# Patient Record
Sex: Female | Born: 2000 | Race: White | Hispanic: No | Marital: Single | Smoking: Former smoker
Health system: Southern US, Community
[De-identification: ages and names within clinical notes are randomized; demographics above are authoritative.]

## PROBLEM LIST (undated history)

## (undated) DIAGNOSIS — K219 Gastro-esophageal reflux disease without esophagitis: Secondary | ICD-10-CM

## (undated) DIAGNOSIS — K589 Irritable bowel syndrome without diarrhea: Secondary | ICD-10-CM

## (undated) DIAGNOSIS — T7840XA Allergy, unspecified, initial encounter: Secondary | ICD-10-CM

## (undated) DIAGNOSIS — N926 Irregular menstruation, unspecified: Secondary | ICD-10-CM

## (undated) DIAGNOSIS — F909 Attention-deficit hyperactivity disorder, unspecified type: Secondary | ICD-10-CM

## (undated) HISTORY — DX: Irregular menstruation, unspecified: N92.6

## (undated) HISTORY — DX: Allergy, unspecified, initial encounter: T78.40XA

## (undated) HISTORY — DX: Irritable bowel syndrome without diarrhea: K58.9

## (undated) HISTORY — DX: Gastro-esophageal reflux disease without esophagitis: K21.9

---

## 2019-01-19 ENCOUNTER — Other Ambulatory Visit: Payer: Self-pay

## 2019-01-19 ENCOUNTER — Emergency Department (HOSPITAL_COMMUNITY)
Admission: EM | Admit: 2019-01-19 | Discharge: 2019-01-19 | Disposition: A | Discharge status: Home / Self Care | Payer: Managed Care, Other (non HMO) | Attending: Emergency Medicine | Admitting: Emergency Medicine

## 2019-01-19 ENCOUNTER — Emergency Department (HOSPITAL_COMMUNITY): Payer: Managed Care, Other (non HMO)

## 2019-01-19 ENCOUNTER — Encounter (HOSPITAL_COMMUNITY): Payer: Self-pay

## 2019-01-19 DIAGNOSIS — N12 Tubulo-interstitial nephritis, not specified as acute or chronic: Secondary | ICD-10-CM | POA: Insufficient documentation

## 2019-01-19 DIAGNOSIS — R1031 Right lower quadrant pain: Secondary | ICD-10-CM | POA: Diagnosis present

## 2019-01-19 HISTORY — DX: Attention-deficit hyperactivity disorder, unspecified type: F90.9

## 2019-01-19 LAB — URINALYSIS, ROUTINE W REFLEX MICROSCOPIC
Bilirubin Urine: NEGATIVE
Glucose, UA: NEGATIVE mg/dL
KETONES UR: NEGATIVE mg/dL
Leukocytes,Ua: NEGATIVE
Nitrite: NEGATIVE
Protein, ur: 100 mg/dL — AB
RBC / HPF: >50 RBC/hpf — ABNORMAL HIGH (ref 0–5)
Specific Gravity, Urine: 1.02 (ref 1.005–1.030)
pH: 5 (ref 5.0–8.0)

## 2019-01-19 LAB — I-STAT BETA HCG BLOOD, ED (MC, WL, AP ONLY): I-stat hCG, quantitative: <5 m[IU]/mL (ref <5)

## 2019-01-19 LAB — COMPREHENSIVE METABOLIC PANEL
ALT: 17 U/L (ref 0–44)
AST: 15 U/L (ref 15–41)
Albumin: 4.2 g/dL (ref 3.5–5.0)
Alkaline Phosphatase: 77 U/L (ref 38–126)
Anion gap: 8 (ref 5–15)
BUN: 11 mg/dL (ref 6–20)
CO2: 23 mmol/L (ref 22–32)
Calcium: 8.8 mg/dL — ABNORMAL LOW (ref 8.9–10.3)
Chloride: 108 mmol/L (ref 98–111)
Creatinine, Ser: 0.64 mg/dL (ref 0.44–1.00)
GFR calc Af Amer: >60 mL/min (ref >60)
GFR calc non Af Amer: >60 mL/min (ref >60)
GLUCOSE: 135 mg/dL — AB (ref 70–99)
Potassium: 4 mmol/L (ref 3.5–5.1)
Sodium: 139 mmol/L (ref 135–145)
Total Bilirubin: 0.7 mg/dL (ref 0.3–1.2)
Total Protein: 7 g/dL (ref 6.5–8.1)

## 2019-01-19 LAB — CBC
HCT: 38.7 % (ref 36.0–46.0)
Hemoglobin: 13.1 g/dL (ref 12.0–15.0)
MCH: 30.7 pg (ref 26.0–34.0)
MCHC: 33.9 g/dL (ref 30.0–36.0)
MCV: 90.6 fL (ref 80.0–100.0)
Platelets: 230 10*3/uL (ref 150–400)
RBC: 4.27 MIL/uL (ref 3.87–5.11)
RDW: 12.5 % (ref 11.5–15.5)
WBC: 12.3 10*3/uL — ABNORMAL HIGH (ref 4.0–10.5)
nRBC: 0 % (ref 0.0–0.2)

## 2019-01-19 LAB — LIPASE, BLOOD: Lipase: 30 U/L (ref 11–51)

## 2019-01-19 MED ORDER — SODIUM CHLORIDE 0.9% FLUSH: 3.0000 mL | Freq: Once | INTRAVENOUS | Status: DC

## 2019-01-19 MED ORDER — NAPROXEN 500 MG PO TABS: 500.0000 mg | ORAL_TABLET | Freq: Two times a day (BID) | ORAL | 0 refills | Status: DC

## 2019-01-19 MED ORDER — CEPHALEXIN 500 MG PO CAPS: 500.0000 mg | ORAL_CAPSULE | Freq: Four times a day (QID) | ORAL | 0 refills | Status: DC

## 2019-01-19 MED ORDER — SODIUM CHLORIDE 0.9 % IV BOLUS
1000.0000 mL | Freq: Once | INTRAVENOUS | Status: AC
  Administered 2019-01-19: 1000 mL via INTRAVENOUS

## 2019-01-19 MED ORDER — ONDANSETRON 4 MG PO TBDP: 4.0000 mg | ORAL_TABLET | Freq: Three times a day (TID) | ORAL | 0 refills | Status: DC | PRN

## 2019-01-19 MED ORDER — SODIUM CHLORIDE 0.9 % IV SOLN
1.0000 g | Freq: Once | INTRAVENOUS | Status: AC
  Administered 2019-01-19: 1 g via INTRAVENOUS
  Filled 2019-01-19: qty 10

## 2019-01-19 MED ORDER — KETOROLAC TROMETHAMINE 15 MG/ML IJ SOLN
15.0000 mg | Freq: Once | INTRAMUSCULAR | Status: AC
  Administered 2019-01-19: 15 mg via INTRAVENOUS
  Filled 2019-01-19: qty 1

## 2019-01-19 NOTE — ED Triage Notes (Signed)
Per EMS- Patient is from [facility]. Patient c/o RLQ abdominal pain 9/10 and N/V.  Patient was given Fentanyl 50 mcg prior to arrival to the ED.

## 2019-01-19 NOTE — Discharge Instructions (Addendum)
You were seen in the emergency department today for flank/abdominal pain.  This time we suspect that your symptoms are related to a bladder infection that had spread to your kidney given you have blood in your urine as well as many bacteria.  We are sending your urine for culture to determine if this is the cause.  Additional labs show that your blood sugar was a bit high at 135, have this rechecked by primary care provider.  Your white blood cell count was a bit high at 12.3, this is consistent with infection.  Your CT scan showed stool in the colon, however it did not show an obstruction, kidney stone, or appendicitis.  Detailed results are below.  We are sending you home with multiple medications to treat your infection and symptoms:  - Keflex-is an antibiotic to treat the infection.  Take this 4 times per day for the next 7 days. - Naproxen is a nonsteroidal anti-inflammatory medication that will help with pain and swelling. Be sure to take this medication as prescribed with food, 1 pill every 12 hours,  It should be taken with food, as it can cause stomach upset, and more seriously, stomach bleeding. Do not take other nonsteroidal anti-inflammatory medications with this such as Advil, Motrin, Aleve, Mobic, Goodie Powder, or Motrin.   - Zofran - this is an anti-nausea medicine to take every 8 hours as needed for nausea/vomiting.   You make take Tylenol per over the counter dosing with these medications.   We have prescribed you new medication(s) today. Discuss the medications prescribed today with your pharmacist as they can have adverse effects and interactions with your other medicines including over the counter and prescribed medications. Seek medical evaluation if you start to experience new or abnormal symptoms after taking one of these medicines, seek care immediately if you start to experience difficulty breathing, feeling of your throat closing, facial swelling, or rash as these could be  indications of a more serious allergic reaction   Please follow-up with your primary care provider within 3 days for reevaluation.  If you do not have a primary care provider you may follow-up with student health, the [facility] [place] [facility], or you may call the phone number circled in your discharge instructions for primary care in the area.  Return to the ER should you experience new or worsening symptoms including but not limited to worsening pain, inability to keep fluids down, fever, or any other concerns.   Results for orders placed or performed during the hospital encounter of 01/19/19  Lipase, blood  Result Value Ref Range   Lipase 30 11 - 51 U/L  Comprehensive metabolic panel  Result Value Ref Range   Sodium 139 135 - 145 mmol/L   Potassium 4.0 3.5 - 5.1 mmol/L   Chloride 108 98 - 111 mmol/L   CO2 23 22 - 32 mmol/L   Glucose, Bld 135 (H) 70 - 99 mg/dL   BUN 11 6 - 20 mg/dL   Creatinine, Ser 9.35 0.44 - 1.00 mg/dL   Calcium 8.8 (L) 8.9 - 10.3 mg/dL   Total Protein 7.0 6.5 - 8.1 g/dL   Albumin 4.2 3.5 - 5.0 g/dL   AST 15 15 - 41 U/L   ALT 17 0 - 44 U/L   Alkaline Phosphatase 77 38 - 126 U/L   Total Bilirubin 0.7 0.3 - 1.2 mg/dL   GFR calc non Af Amer >60 >60 mL/min   GFR calc Af Amer >60 >  60 mL/min   Anion gap 8 5 - 15  CBC  Result Value Ref Range   WBC 12.3 (H) 4.0 - 10.5 K/uL   RBC 4.27 3.87 - 5.11 MIL/uL   Hemoglobin 13.1 12.0 - 15.0 g/dL   HCT 81.8 29.9 - 37.1 %   MCV 90.6 80.0 - 100.0 fL   MCH 30.7 26.0 - 34.0 pg   MCHC 33.9 30.0 - 36.0 g/dL   RDW 69.6 78.9 - 38.1 %   Platelets 230 150 - 400 K/uL   nRBC 0.0 0.0 - 0.2 %  Urinalysis, Routine w reflex microscopic  Result Value Ref Range   Color, Urine AMBER (A) YELLOW   APPearance CLOUDY (A) CLEAR   Specific Gravity, Urine 1.020 1.005 - 1.030   pH 5.0 5.0 - 8.0   Glucose, UA NEGATIVE NEGATIVE mg/dL   Hgb urine dipstick LARGE (A) NEGATIVE   Bilirubin Urine NEGATIVE NEGATIVE   Ketones, ur  NEGATIVE NEGATIVE mg/dL   Protein, ur 017 (A) NEGATIVE mg/dL   Nitrite NEGATIVE NEGATIVE   Leukocytes,Ua NEGATIVE NEGATIVE   RBC / HPF >50 (H) 0 - 5 RBC/hpf   WBC, UA 21-50 0 - 5 WBC/hpf   Bacteria, UA MANY (A) NONE SEEN   Squamous Epithelial / LPF 6-10 0 - 5   WBC Clumps PRESENT    Mucus PRESENT   I-Stat beta hCG blood, ED  Result Value Ref Range   I-stat hCG, quantitative <5.0 <5 mIU/mL   Comment 3           Ct Renal Stone Study  Result Date: 01/19/2019 CLINICAL DATA:  Right lower quadrant pain, nausea, vomiting, hematuria EXAM: CT ABDOMEN AND PELVIS WITHOUT CONTRAST TECHNIQUE: Multidetector CT imaging of the abdomen and pelvis was performed following the standard protocol without IV contrast. COMPARISON:  None. FINDINGS: Lower chest: No acute abnormality. Hepatobiliary: No focal liver abnormality is seen. No gallstones, gallbladder wall thickening, or biliary dilatation. Pancreas: Unremarkable. No pancreatic ductal dilatation or surrounding inflammatory changes. Spleen: Normal in size without focal abnormality. Adrenals/Urinary Tract: Adrenal glands are unremarkable. Kidneys are normal, without renal calculi, focal lesion, or hydronephrosis. Bladder is unremarkable. Stomach/Bowel: Stomach is within normal limits. Appendix appears normal. No evidence of bowel wall thickening, distention, or inflammatory changes. Large burden of stool in the colon. Vascular/Lymphatic: No significant vascular findings are present. No enlarged abdominal or pelvic lymph nodes. Reproductive: No mass or other abnormality. Other: No abdominal wall hernia or abnormality. No abdominopelvic ascites. Musculoskeletal: No acute or significant osseous findings. IMPRESSION: 1. No definite non-contrast CT findings of the abdomen or pelvis to explain pain. 2.  No evidence of urinary tract calculus or hydronephrosis. 3.  Normal appendix. 4.  Large burden of stool in colon. Electronically Signed   By: Lauralyn Primes M.D.   On:  01/19/2019 15:53

## 2019-01-19 NOTE — ED Provider Notes (Signed)
[facility]-EMERGENCY DEPT Provider Note   CSN: [id number] Arrival date & time: 01/19/19  1418    History   Chief Complaint Chief Complaint  Patient presents with   Abdominal Pain   Emesis    HPI Kristie Horn is a 18 y.o. female with a hx of ADHD who arrives to the ED via EMS with complaints of abdominal pain that began at 0800 this AM. Patient states she awoke with patient to the R flank/RLQ that was mild, she was able to go back to sleep, however she then woke up with increased intensity of pain associated w/ nausea. EMS was called. She received  of fentanyl en route w/ significant improvement. Current pain is a 1/10 in severity and she does not feel nauseous at present. She notes she has had some blood in her urine the past two days as well which is atypical for her. Denies fever, emesis (despite triage note), diarrhea, melena, hematochezia, dysuria, vaginal bleeding, or vaginal discharge. She is not currently sexually active and has no concern for STD.      HPI  Past Medical History:  Diagnosis Date   ADHD     There are no active problems to display for this patient.   History reviewed. No pertinent surgical history.   OB History   No obstetric history on file.      Home Medications    Prior to Admission medications   Not on File    Family History Family History  Problem Relation Age of Onset   Crohn's disease Mother    Healthy Father     Social History Social History   Tobacco Use   Smoking status: Never Smoker   Smokeless tobacco: Never Used  Substance Use Topics   Alcohol use: Never    Frequency: Never   Drug use: Never     Allergies   Other   Review of Systems Review of Systems  Constitutional: Negative for fever.  Respiratory: Negative for shortness of breath.   Cardiovascular: Negative for chest pain.  Gastrointestinal: Positive for abdominal pain and nausea. Negative for blood in stool, constipation,  diarrhea and vomiting.  Genitourinary: Positive for flank pain and hematuria. Negative for dysuria, frequency, urgency, vaginal bleeding and vaginal discharge.  All other systems reviewed and are negative.    Physical Exam Updated Vital Signs BP 97/61 (BP Location: Right Arm)    Pulse 60    Temp 97.6 F (36.4 C) (Oral)    Resp 13    Ht 5\' 3"  (1.6 m)    Wt 63.5 kg    LMP 12/21/2018 (Approximate) Comment: irregular periods   SpO2 100%    BMI 24.80 kg/m   Physical Exam Vitals signs and nursing note reviewed.  Constitutional:      General: She is not in acute distress.    Appearance: She is well-developed. She is not toxic-appearing.  HENT:     Head: Normocephalic and atraumatic.  Eyes:     General:        Right eye: No discharge.        Left eye: No discharge.     Conjunctiva/sclera: Conjunctivae normal.  Neck:     Musculoskeletal: Neck supple.  Cardiovascular:     Rate and Rhythm: Normal rate and regular rhythm.  Pulmonary:     Effort: Pulmonary effort is normal. No respiratory distress.     Breath sounds: Normal breath sounds. No wheezing, rhonchi or rales.  Abdominal:  General: There is no distension.     Palpations: Abdomen is soft.     Tenderness: There is no abdominal tenderness. There is no right CVA tenderness, left CVA tenderness, guarding or rebound. Negative signs include McBurney's sign.  Skin:    General: Skin is warm and dry.     Findings: No rash.  Neurological:     Mental Status: She is alert.     Comments: Clear speech.   Psychiatric:        Behavior: Behavior normal.      ED Treatments / Results  Labs (all labs ordered are listed, but only abnormal results are displayed) Labs Reviewed  COMPREHENSIVE METABOLIC PANEL - Abnormal; Notable for the following components:      Result Value   Glucose, Bld 135 (*)    Calcium 8.8 (*)    All other components within normal limits  CBC - Abnormal; Notable for the following components:   WBC 12.3 (*)     All other components within normal limits  URINALYSIS, ROUTINE W REFLEX MICROSCOPIC - Abnormal; Notable for the following components:   Color, Urine AMBER (*)    APPearance CLOUDY (*)    Hgb urine dipstick LARGE (*)    Protein, ur 100 (*)    RBC / HPF >50 (*)    Bacteria, UA MANY (*)    All other components within normal limits  URINE CULTURE  LIPASE, BLOOD  I-STAT BETA HCG BLOOD, ED ([facility], [facility], [facility] ONLY)    EKG None  Radiology Ct Renal Stone Study  Result Date: 01/19/2019 CLINICAL DATA:  Right lower quadrant pain, nausea, vomiting, hematuria EXAM: CT ABDOMEN AND PELVIS WITHOUT CONTRAST TECHNIQUE: Multidetector CT imaging of the abdomen and pelvis was performed following the standard protocol without IV contrast. COMPARISON:  None. FINDINGS: Lower chest: No acute abnormality. Hepatobiliary: No focal liver abnormality is seen. No gallstones, gallbladder wall thickening, or biliary dilatation. Pancreas: Unremarkable. No pancreatic ductal dilatation or surrounding inflammatory changes. Spleen: Normal in size without focal abnormality. Adrenals/Urinary Tract: Adrenal glands are unremarkable. Kidneys are normal, without renal calculi, focal lesion, or hydronephrosis. Bladder is unremarkable. Stomach/Bowel: Stomach is within normal limits. Appendix appears normal. No evidence of bowel wall thickening, distention, or inflammatory changes. Large burden of stool in the colon. Vascular/Lymphatic: No significant vascular findings are present. No enlarged abdominal or pelvic lymph nodes. Reproductive: No mass or other abnormality. Other: No abdominal wall hernia or abnormality. No abdominopelvic ascites. Musculoskeletal: No acute or significant osseous findings. IMPRESSION: 1. No definite non-contrast CT findings of the abdomen or pelvis to explain pain. 2.  No evidence of urinary tract calculus or hydronephrosis. 3.  Normal appendix. 4.  Large burden of stool in colon. Electronically Signed   By: Lauralyn Primes  M.D.   On: 01/19/2019 15:53    Procedures Procedures (including critical care time)  Medications Ordered in ED Medications  sodium chloride flush (NS) 0.9 % injection 3 mL (has no administration in time range)  cefTRIAXone (ROCEPHIN) 1 g in sodium chloride 0.9 % 100 mL IVPB (has no administration in time range)  ketorolac (TORADOL) 15 MG/ML injection 15 mg (has no administration in time range)  sodium chloride 0.9 % bolus 1,000 mL (1,000 mLs Intravenous New Bag/Given 01/19/19 1608)     Initial Impression / Assessment and Plan / ED Course  I have reviewed the triage vital signs and the nursing notes.  Pertinent labs & imaging results that were available during my care of the patient  were reviewed by me and considered in my medical decision making (see chart for details).    Patient presents to the ED with complaints of abdominal/flank pain. Patient nontoxic appearing, in no apparent distress, vitals WNL upon arrival. On exam patient has a nontender abdomen, no peritoneal signs. DDX: nephrolithiasis, UTI/pyelo, appendicitis, pancreatitis, cholecystitis, obstruction/perforation, PID, ectopic pregnancy, MSK. Will evaluate with labs and CT renal study. Fluids ordered, patient informed to alert me of return of discomfort or nausea.   ER work-up reviewed:  CBC: Mild leukocytosis @ 12.3. No anemia CMP: Mild hyperglycemia @ 135. Mild hypocalcemia @ 8.8. Renal function preserved. LFTs WNL Lipase: WNL UA: Concerning for infection w/ many bacteria, hematuria.  Preg test: Negative Imaging:  No definite non-contrast CT findings of the abdomen or pelvis to explain pain. No evidence of urinary tract calculus or hydronephrosis. Normal appendix. Large burden of stool in colon.  Repeat abdominal exam patient remains nontender without peritoneal signs.  She again denies concern for STD, she is nontender, doubt PID.  CT scan reassuring, no stone, no appendicitis, no obstruction/perforation.  At this time  suspect pyelonephritis given dirty urine with flank pain.  She will be given Toradol for additional pain control as some of the pain has started to return as well as a 1g of Rocephin. Urine culture added on.   Patient tolerating PO in the emergency department, feeling much better. She does not appear septic or to require admission at this time, will discharge home with keflex & naproxen. I discussed results, treatment plan, need for PCP follow-up, and return precautions with the patient & her mother. Provided opportunity for questions, patient & her mother confirmed understanding and are in agreement with plan.   Final Clinical Impressions(s) / ED Diagnoses   Final diagnoses:  Pyelonephritis    ED Discharge Orders         Ordered    cephALEXin (KEFLEX) 500 MG capsule  4 times daily     01/19/19 1818    naproxen (NAPROSYN) 500 MG tablet  2 times daily     01/19/19 1818    ondansetron (ZOFRAN ODT) 4 MG disintegrating tablet  Every 8 hours PRN     01/19/19 1818           Aulton Routt, Pleas Koch, PA-C 01/19/19 1826    Gerhard Munch, MD 01/21/19 [phone]

## 2019-01-20 LAB — URINE CULTURE

## 2019-09-18 IMAGING — CT CT RENAL STONE PROTOCOL
2 of 4 series · 17 of 46 positions shown, 19 images · non-contrast
Comparison: None.

CLINICAL DATA: Right lower quadrant pain, nausea, vomiting,
hematuria

EXAM:
CT ABDOMEN AND PELVIS WITHOUT CONTRAST
TECHNIQUE: Multidetector CT imaging of the abdomen and pelvis was performed
following the standard protocol without IV contrast.

[Series 2: axial st · axial · 0.69mm/px · z∈[+1352,+1748]mm · 14 of 89 slices shown, 16 images]
[im 5/89  soft-tissue]
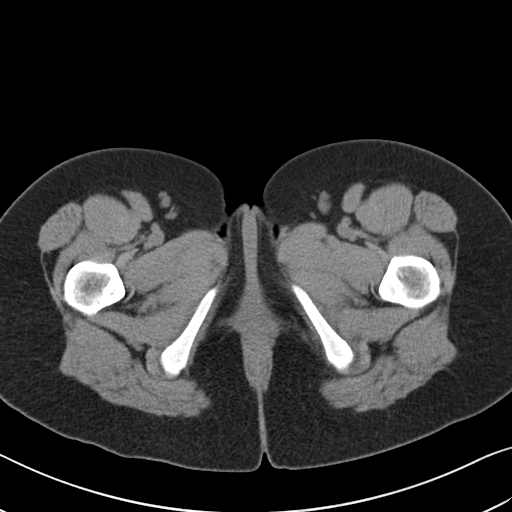
[im 5/89  bone]
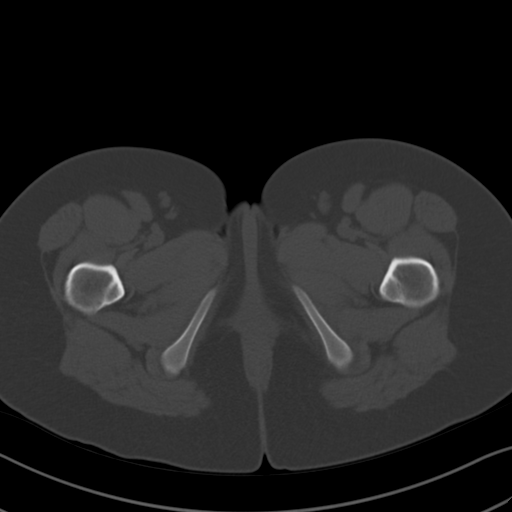
[im 10/89  soft-tissue]
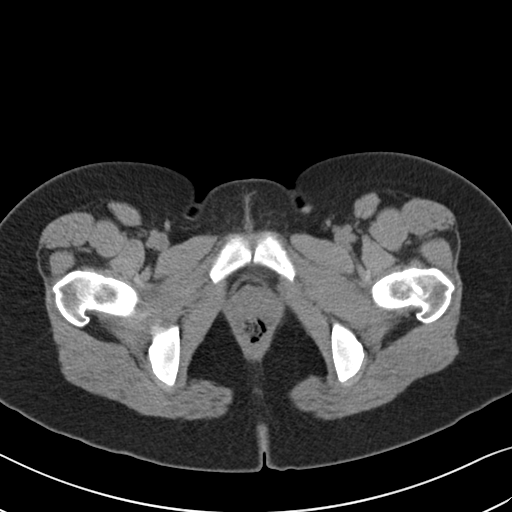
[im 19/89  soft-tissue]
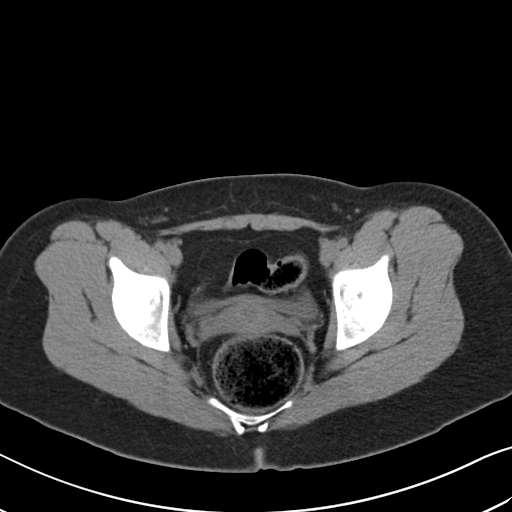
[im 24/89  soft-tissue]
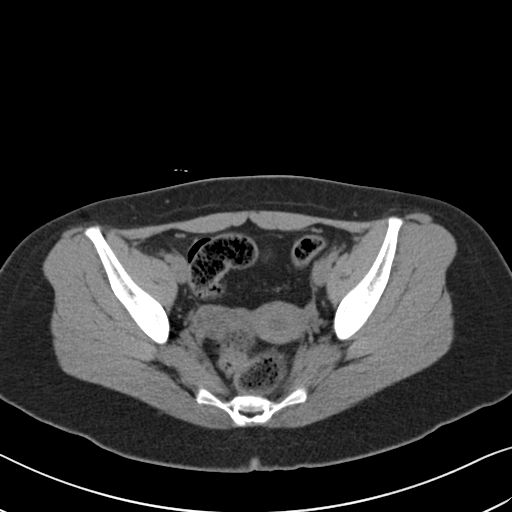
[im 28/89  soft-tissue]
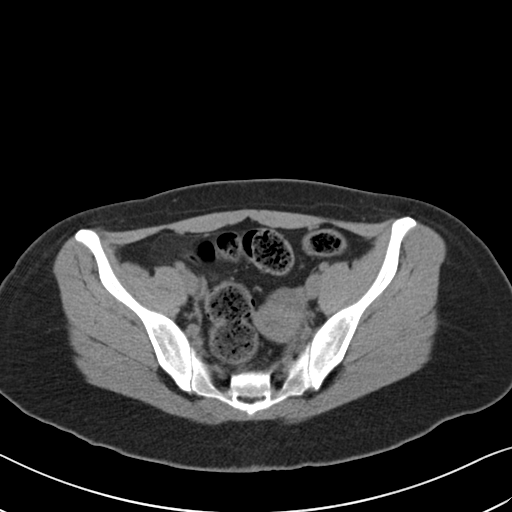
[im 38/89  soft-tissue]
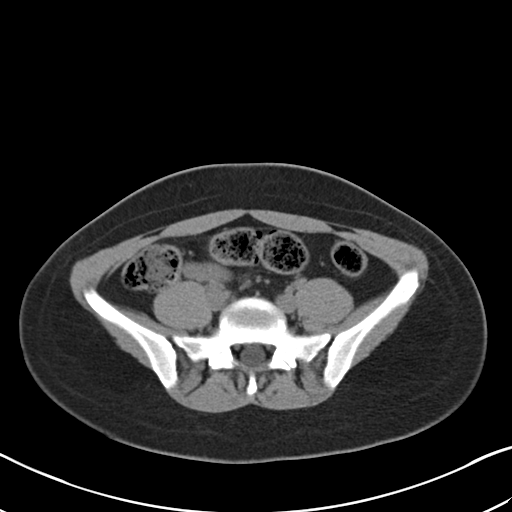
[im 42/89  soft-tissue]
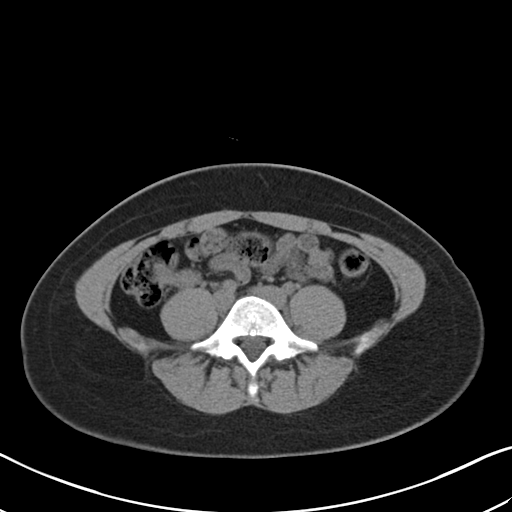
[im 47/89  soft-tissue]
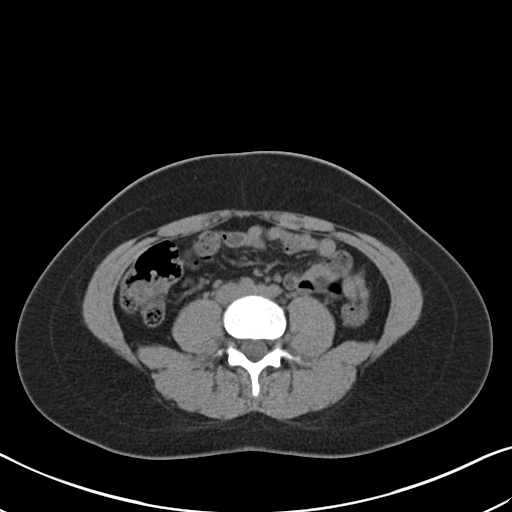
[im 51/89  soft-tissue]
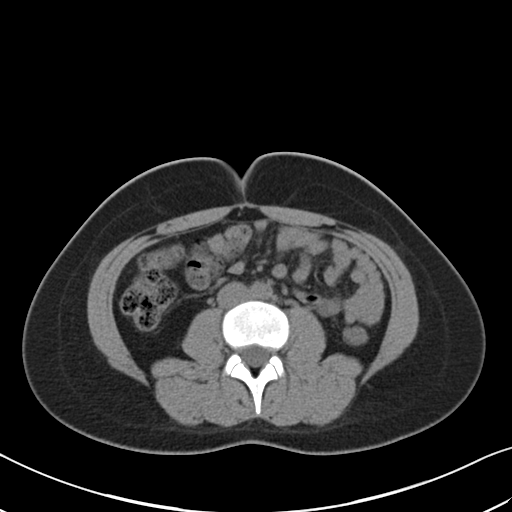
[im 51/89  bone]
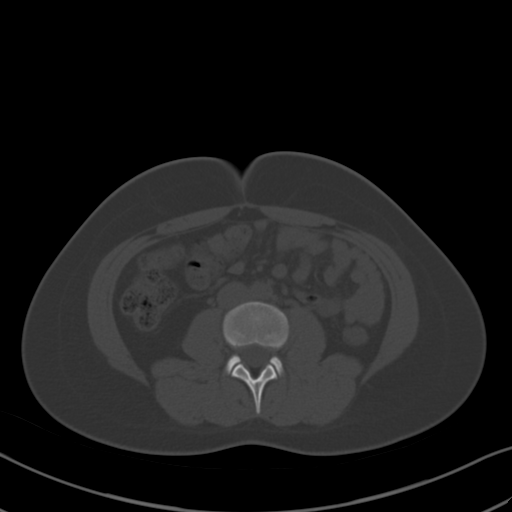
[im 61/89  soft-tissue]
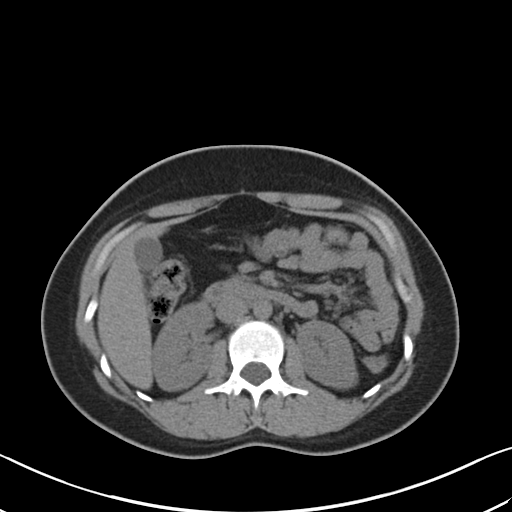
[im 65/89  soft-tissue]
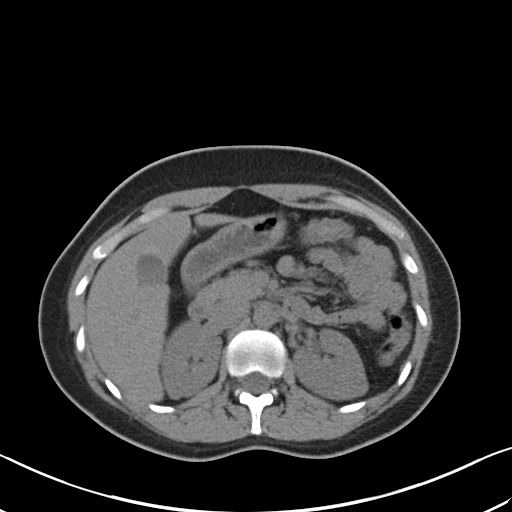
[im 70/89  soft-tissue]
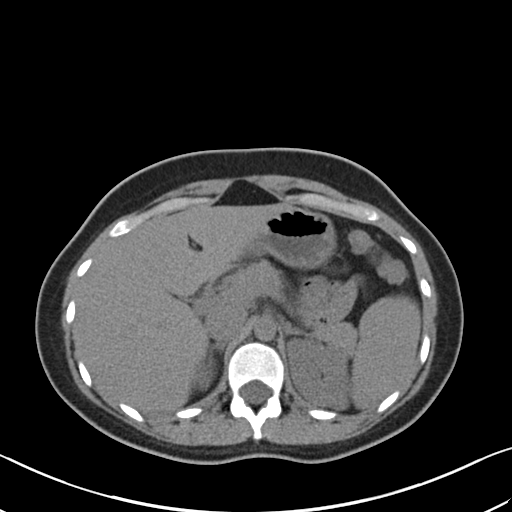
[im 79/89  soft-tissue]
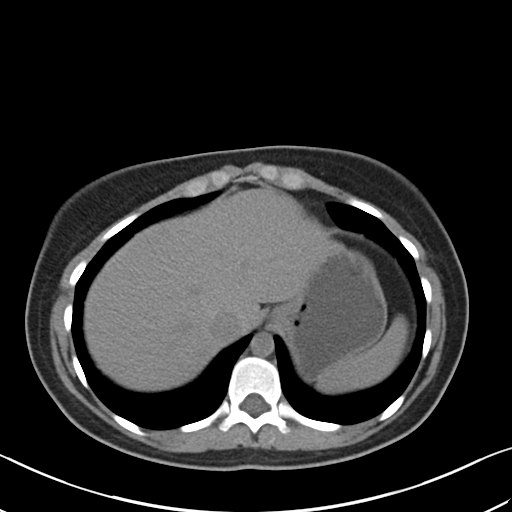
[im 84/89  soft-tissue]
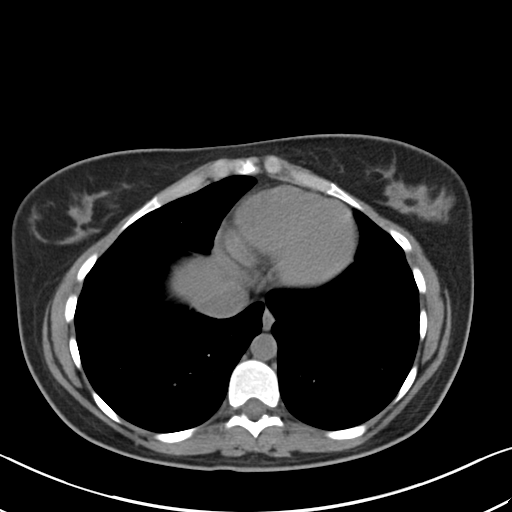

[Series 4: coronal · coronal · 0.92mm/px · 3 of 102 slices shown]
[im 34/102  soft-tissue]
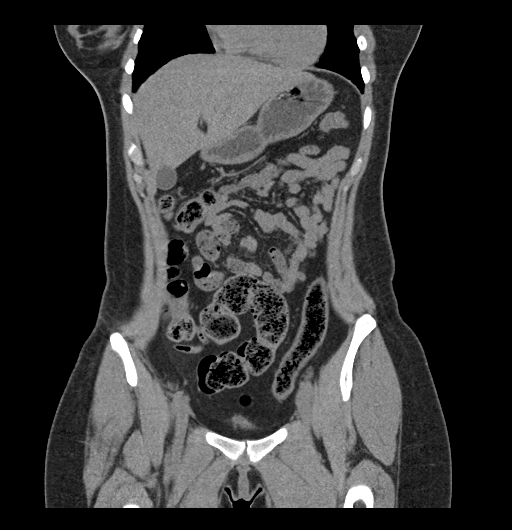
[im 45/102  soft-tissue]
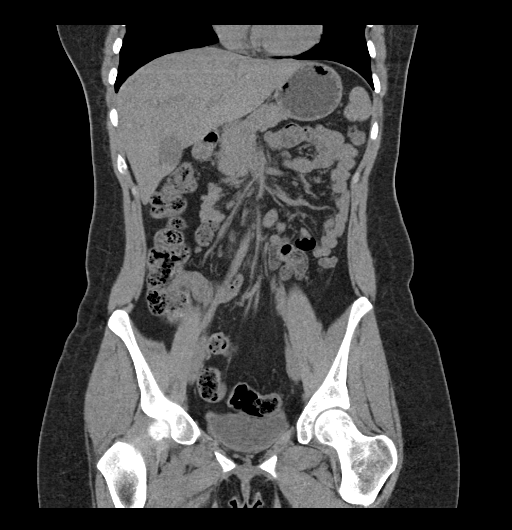
[im 57/102  soft-tissue]
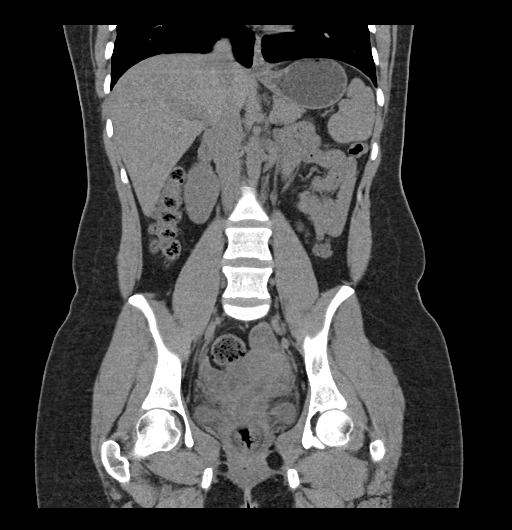

[17 of 46 positions shown; findings below may reference images not displayed]

FINDINGS: Lower chest: No acute abnormality.

Hepatobiliary: No focal liver abnormality is seen. No gallstones,
gallbladder wall thickening, or biliary dilatation.

Pancreas: Unremarkable. No pancreatic ductal dilatation or
surrounding inflammatory changes.

Spleen: Normal in size without focal abnormality.

Adrenals/Urinary Tract: Adrenal glands are unremarkable. Kidneys are
normal, without renal calculi, focal lesion, or hydronephrosis.
Bladder is unremarkable.

Stomach/Bowel: Stomach is within normal limits. Appendix appears
normal. No evidence of bowel wall thickening, distention, or
inflammatory changes. Large burden of stool in the colon.

Vascular/Lymphatic: No significant vascular findings are present. No
enlarged abdominal or pelvic lymph nodes.

Reproductive: No mass or other abnormality.

Other: No abdominal wall hernia or abnormality. No abdominopelvic
ascites.

Musculoskeletal: No acute or significant osseous findings.
IMPRESSION: 1. No definite non-contrast CT findings of the abdomen or pelvis to
explain pain.

2.  No evidence of urinary tract calculus or hydronephrosis.

3.  Normal appendix.

4.  Large burden of stool in colon.

## 2021-04-28 LAB — HM PAP SMEAR

## 2022-05-21 ENCOUNTER — Encounter (HOSPITAL_COMMUNITY): Payer: Self-pay

## 2022-05-21 ENCOUNTER — Encounter: Payer: Self-pay | Admitting: Physician Assistant

## 2022-05-21 ENCOUNTER — Ambulatory Visit: Payer: Managed Care, Other (non HMO) | Admitting: Physician Assistant

## 2022-05-21 VITALS — BP 108/70 | HR 72 | Temp 98.4°F | Ht 64.0 in | Wt 172.0 lb

## 2022-05-21 DIAGNOSIS — N926 Irregular menstruation, unspecified: Secondary | ICD-10-CM | POA: Diagnosis not present

## 2022-05-21 DIAGNOSIS — Z1159 Encounter for screening for other viral diseases: Secondary | ICD-10-CM

## 2022-05-21 DIAGNOSIS — K30 Functional dyspepsia: Secondary | ICD-10-CM

## 2022-05-21 DIAGNOSIS — Z7184 Encounter for health counseling related to travel: Secondary | ICD-10-CM

## 2022-05-21 DIAGNOSIS — K219 Gastro-esophageal reflux disease without esophagitis: Secondary | ICD-10-CM

## 2022-05-21 DIAGNOSIS — Z114 Encounter for screening for human immunodeficiency virus [HIV]: Secondary | ICD-10-CM

## 2022-05-21 LAB — CBC WITH DIFFERENTIAL/PLATELET
Basophils Absolute: 0.1 10*3/uL (ref 0.0–0.1)
Basophils Relative: 0.9 % (ref 0.0–3.0)
Eosinophils Absolute: 0.1 10*3/uL (ref 0.0–0.7)
Eosinophils Relative: 1.6 % (ref 0.0–5.0)
HCT: 40.8 % (ref 36.0–46.0)
Hemoglobin: 13.5 g/dL (ref 12.0–15.0)
Lymphocytes Relative: 21.4 % (ref 12.0–46.0)
Lymphs Abs: 1.8 10*3/uL (ref 0.7–4.0)
MCHC: 33.1 g/dL (ref 30.0–36.0)
MCV: 84.9 fl (ref 78.0–100.0)
Monocytes Absolute: 0.5 10*3/uL (ref 0.1–1.0)
Monocytes Relative: 6.2 % (ref 3.0–12.0)
Neutro Abs: 5.9 10*3/uL (ref 1.4–7.7)
Neutrophils Relative %: 69.9 % (ref 43.0–77.0)
Platelets: 294 10*3/uL (ref 150.0–400.0)
RBC: 4.81 Mil/uL (ref 3.87–5.11)
RDW: 14.4 % (ref 11.5–15.5)
WBC: 8.4 10*3/uL (ref 4.0–10.5)

## 2022-05-21 LAB — COMPREHENSIVE METABOLIC PANEL
ALT: 35 U/L (ref 0–35)
AST: 20 U/L (ref 0–37)
Albumin: 4.7 g/dL (ref 3.5–5.2)
Alkaline Phosphatase: 108 U/L (ref 39–117)
BUN: 7 mg/dL (ref 6–23)
CO2: 27 mEq/L (ref 19–32)
Calcium: 9.6 mg/dL (ref 8.4–10.5)
Chloride: 102 mEq/L (ref 96–112)
Creatinine, Ser: 0.69 mg/dL (ref 0.40–1.20)
GFR: 122.72 mL/min (ref >60.00)
Glucose, Bld: 103 mg/dL — ABNORMAL HIGH (ref 70–99)
Potassium: 4 mEq/L (ref 3.5–5.1)
Sodium: 140 mEq/L (ref 135–145)
Total Bilirubin: 0.5 mg/dL (ref 0.2–1.2)
Total Protein: 7.5 g/dL (ref 6.0–8.3)

## 2022-05-21 LAB — TSH: TSH: 1.79 u[IU]/mL (ref 0.35–5.50)

## 2022-05-21 LAB — HEMOGLOBIN A1C: Hgb A1c MFr Bld: 5.5 % (ref 4.6–6.5)

## 2022-05-21 LAB — TESTOSTERONE: Testosterone: 86.02 ng/dL — ABNORMAL HIGH (ref 15.00–40.00)

## 2022-05-21 MED ORDER — SCOPOLAMINE 1 MG/3DAYS TD PT72: 1.0000 | MEDICATED_PATCH | TRANSDERMAL | 0 refills | Status: DC

## 2022-05-21 NOTE — Progress Notes (Signed)
Kristie Horn is a 21 y.o. female here for a follow up on irregular menstrual cycle.   History of Present Illness:   Chief Complaint  Patient presents with   Establish Care   Menstrual Problem    Pt has always had irregular periods was on OC's but ran out. Pt just got her period on 7/13 first one in 7 months.    HPI  Irregular Menstrual Cycle Patient here to establish care. She presents with c/o of  irregular periods for the past several months. She was on contraceptive pills in the past but has run out off her medication. States she was started on contraceptive pills by her OB/GYN. Has not had regular menstrual cycle for the past 7 months until last week on Friday. Has had pap smear via OB/GYN in 2021 which was normal. Has been struggling with irregular periods since she was a teenager. States her periods have always been irregular. Denies any concern for pregnancy.   Patient has been struggling with heavy/painful periods and is concerned about this issue. Has noticed some new acne and increased facial hair growth. She is currently taking Midol PO as needed.  At this time, she is interested for this to be further evaluated.    GERD Patient has had issue with acid reflux for the past several months. Comes and goes. Her symptoms are well controlled at this time. States she does take Tums which has been helping. Has had issue with acid reflux for a week and then after several months. Denies any worsening sx.    Indigestion Patient complain of indigestion and abdominal cramping for the past few months. States her BM are regular. However, she does feel constipated sometimes. Has had noticed some blood in stool which she described as " specks". Denies seeing blood in the toilet bowel. Does have a family hx of crohn's disease in her mother. No reported weight loss or any other sx.    Travel advice encounter She is planning to go on a cruise this fall. She has tried dramamine in the past without  relief of symptoms, wondering what else she can do for motion sickness.    Past Medical History:  Diagnosis Date   Allergy    GERD (gastroesophageal reflux disease)    Irregular menstrual cycle      Social History   Tobacco Use   Smoking status: Former    Types: Cigarettes   Smokeless tobacco: Never   Tobacco comments:    Prior social smoking  Vaping Use   Vaping Use: Former  Substance Use Topics   Alcohol use: Yes    Alcohol/week: 1.0 standard drink of alcohol    Types: 1 Cans of beer per week   Drug use: Never    History reviewed. No pertinent surgical history.  Family History  Problem Relation Age of Onset   Crohn's disease Mother    Lung cancer Maternal Grandmother    Cancer Paternal Grandfather        ?lung   Breast cancer Paternal Aunt    Infertility Paternal Aunt     Allergies  Allergen Reactions   Other Hives    NUTS    Current Medications:   Current Outpatient Medications:    Acetaminophen (MIDOL PO), Take 1 tablet by mouth as needed., Disp: , Rfl:    acetaminophen (TYLENOL) 325 MG tablet, Take 1,300 mg by mouth every 6 (six) hours as needed., Disp: , Rfl:    Review of Systems:   ROS  Negative unless otherwise specified per HPI.   Vitals:   Vitals:   05/21/22 0846  BP: 108/70  Pulse: 72  Temp: 98.4 F (36.9 C)  TempSrc: Temporal  SpO2: 98%  Weight: 172 lb (78 kg)  Height: 5\' 4"  (1.626 m)     Body mass index is 29.52 kg/m.  Physical Exam:   Physical Exam Vitals and nursing note reviewed.  Constitutional:      General: She is not in acute distress.    Appearance: She is well-developed. She is not ill-appearing or toxic-appearing.  Cardiovascular:     Rate and Rhythm: Normal rate and regular rhythm.     Pulses: Normal pulses.     Heart sounds: Normal heart sounds, S1 normal and S2 normal.  Pulmonary:     Effort: Pulmonary effort is normal.     Breath sounds: Normal breath sounds.  Abdominal:     General: Abdomen is flat.  Bowel sounds are normal.     Palpations: Abdomen is soft.     Tenderness: There is no abdominal tenderness.  Skin:    General: Skin is warm and dry.  Neurological:     Mental Status: She is alert.     GCS: GCS eye subscore is 4. GCS verbal subscore is 5. GCS motor subscore is 6.  Psychiatric:        Speech: Speech normal.        Behavior: Behavior normal. Behavior is cooperative.     Assessment and Plan:   Irregular periods Unclear etiology Suspect possible PCOS Update blood work today and provide appropriate recommendations -- update blood work to assess hormones, A1c, TSH Likely restart start OCP  May send to gyn if needed  Gastroesophageal reflux disease without esophagitis No red flags Trial prn prilosec or nexium If worsening/lack of improvement, follow-up  Indigestion No red flags Work on good bowel health with fiber, fluids If worsening/lack of improvement, follow-up - like refer to GI  Travel advice encounter Trial scopolamine patches  Encounter for screening for other viral diseases Update today  Screening for HIV (human immunodeficiency virus) Update today   I,Savera Zaman,acting as a scribe for , PA.,have documented all relevant documentation on the behalf of [employer], PA,as directed by  Jarold Motto, PA while in the presence of Jarold Motto, Jarold Motto.   I, [place], Jarold Motto, have reviewed all documentation for this visit. The documentation on 05/21/22 for the exam, diagnosis, procedures, and orders are all accurate and complete.   05/23/22, PA-C

## 2022-05-21 NOTE — Patient Instructions (Signed)
It was great to see you!  We will be in touch with your results via mychart.  For your heartburn -- consider as needed over the counter nexium or prilosec (generic is fine). If this does not help your symptoms, let us know.  I have sent in scopolamine patches for you to trial on your upcoming trip.  Let's follow-up in 3-6 months for a physical, sooner if you have concerns.  If a referral was placed today --> you will be contacted for an appointment. Please note that routine referrals can sometimes take up to 3-4 weeks to process. Please call our office if you haven't heard anything after this time frame.  If blood work, urine studies, or any imaging was ordered today -->  we will release your results to you on your MyChart account (if you have chosen to sign up for this) with further instructions. You may see the results before I do, but when I review them I will send you a message with my report or have my staff call you if things need to be discussed. Please reply to my message with any questions.   Take care,  Jarold Motto PA-C

## 2022-05-23 ENCOUNTER — Encounter: Payer: Self-pay | Admitting: *Deleted

## 2022-05-25 LAB — 17-HYDROXYPROGESTERONE: 17-OH-Progesterone, LC/MS/MS: 59 ng/dL

## 2022-05-25 LAB — HEPATITIS C ANTIBODY: Hepatitis C Ab: NONREACTIVE

## 2022-05-25 LAB — HIV ANTIBODY (ROUTINE TESTING W REFLEX): HIV 1&2 Ab, 4th Generation: NONREACTIVE

## 2022-06-06 ENCOUNTER — Encounter: Payer: Self-pay | Admitting: Physician Assistant

## 2022-07-30 ENCOUNTER — Encounter: Payer: Self-pay | Admitting: *Deleted

## 2022-10-18 ENCOUNTER — Encounter: Payer: Self-pay | Admitting: *Deleted

## 2022-11-21 ENCOUNTER — Encounter: Payer: Self-pay | Admitting: Physician Assistant

## 2022-11-21 ENCOUNTER — Ambulatory Visit (INDEPENDENT_AMBULATORY_CARE_PROVIDER_SITE_OTHER): Payer: Managed Care, Other (non HMO) | Admitting: Physician Assistant

## 2022-11-21 VITALS — BP 90/60 | HR 63 | Temp 97.7°F | Ht 64.0 in | Wt 166.4 lb

## 2022-11-21 DIAGNOSIS — K582 Mixed irritable bowel syndrome: Secondary | ICD-10-CM

## 2022-11-21 DIAGNOSIS — F902 Attention-deficit hyperactivity disorder, combined type: Secondary | ICD-10-CM

## 2022-11-21 DIAGNOSIS — F909 Attention-deficit hyperactivity disorder, unspecified type: Secondary | ICD-10-CM | POA: Insufficient documentation

## 2022-11-21 DIAGNOSIS — Z0000 Encounter for general adult medical examination without abnormal findings: Secondary | ICD-10-CM

## 2022-11-21 DIAGNOSIS — K589 Irritable bowel syndrome without diarrhea: Secondary | ICD-10-CM | POA: Insufficient documentation

## 2022-11-21 DIAGNOSIS — K219 Gastro-esophageal reflux disease without esophagitis: Secondary | ICD-10-CM

## 2022-11-21 DIAGNOSIS — E282 Polycystic ovarian syndrome: Secondary | ICD-10-CM | POA: Insufficient documentation

## 2022-11-21 MED ORDER — LISDEXAMFETAMINE DIMESYLATE 10 MG PO CAPS: 10.0000 mg | ORAL_CAPSULE | Freq: Every day | ORAL | 0 refills | Status: AC

## 2022-11-21 NOTE — Patient Instructions (Signed)
It was great to see you!  Let me know if you would like to see gastroenterology  Start Vyvanse 10 mg daily This will likely require a prior authorization -- we will be in touch with this and if we need to try Adderall Extended Release first  Follow-up in 3 months after starting your ADHD medications to check in  Take care,  Aldona Bar

## 2022-11-21 NOTE — Progress Notes (Signed)
Subjective:    Kristie Horn is a 22 y.o. female and is here for a comprehensive physical exam.  HPI  Health Maintenance Due  Topic Date Due   DTaP/Tdap/Td (7 - Td or Tdap) 07/05/2019    Acute Concerns: None  Chronic Issues: GERD Symptoms well controlled.  IBS She reports intermittent abdominal discomfort localized to 2 areas with episodes of constipation. Pain is moderate and resolves within 2 days usually. Using OTC supplement for this. Has mixed constipation and diarrhea. She has had blood in her stools in the past, with straining after constipation. Blood streaked stools- occurs every other month. Has never seen GI but is considering this. May want referral in the future. Mother has Hx of Crohn's.  PCOS Symptoms well controlled with Tylenol/Midol prn. Does not want to be on contraceptive.  ADHD Diagnosed as a child. Has been on several medications- most recently on low dose Vyvanse a few years ago. She was on Focalin as a child- secondary GI upset so she did not tolerate it. Would like to restart a medication to take only on school days. Will have her previous prescriber send her records to [place].   Health Maintenance: Immunizations -- Due for Tdap booster, otherwise UTD Colonoscopy -- N/A, no family history of colon cancer Mammogram -- N/A PAP -- Last done 04/2021 Bone Density -- N/A Diet -- Tries to eat a balanced diet Exercise -- Standing/walking at work but otherwise does not have an exercise routine. Plans to start an exercise regimen soon.  Sleep habits -- Works 3rd shift which causes some difficulty with sleep Mood -- Doing well overall  UTD with dentist? - Yes UTD with eye doctor? - Yes  Weight history: Wt Readings from Last 10 Encounters:  11/21/22 166 lb 6.1 oz (75.5 kg)  05/21/22 172 lb (78 kg)  01/19/19 140 lb (63.5 kg) (70 %, Z= 0.51)*   * Growth percentiles are based on CDC (Girls, 2-20 Years) data.   Body mass index is 28.56  kg/m. Patient's last menstrual period was 10/18/2022 (approximate).  Alcohol use:  reports current alcohol use of about 1.0 standard drink of alcohol per week.  Tobacco use:  Tobacco Use: Medium Risk (11/21/2022)   Patient History    Smoking Tobacco Use: Former    Smokeless Tobacco Use: Never    Passive Exposure: Not on file   Eligible for lung cancer screening? no     11/21/2022    8:05 AM  Depression screen PHQ 2/9  Decreased Interest 0  Down, Depressed, Hopeless 0  PHQ - 2 Score 0     Other providers/specialists: Patient Care Team: Inda Coke, [place] as PCP - General (Physician Assistant) Patient, No Pcp Per (General Practice) Pecola Lawless, DO as Consulting Physician (Gynecology)    PMHx, SurgHx, SocialHx, Medications, and Allergies were reviewed in the Visit Navigator and updated as appropriate.   Past Medical History:  Diagnosis Date   ADHD    Allergy    GERD (gastroesophageal reflux disease)    Irregular menstrual cycle     History reviewed. No pertinent surgical history.   Family History  Problem Relation Age of Onset   Crohn's disease Mother    ADD / ADHD Mother    Healthy Father    Lung cancer Maternal Grandmother    Alcohol abuse Maternal Grandfather    Cancer Paternal Grandfather        ?lung   Breast cancer Paternal Aunt    Infertility Paternal Aunt  Colon cancer Neg Hx     Social History   Tobacco Use   Smoking status: Former    Years: 0.50    Types: Cigarettes   Smokeless tobacco: Never   Tobacco comments:    Surveyor, minerals in college  Vaping Use   Vaping Use: Former  Substance Use Topics   Alcohol use: Yes    Alcohol/week: 1.0 standard drink of alcohol    Types: 1 Cans of beer per week   Drug use: Never    Review of Systems:   Review of Systems  Constitutional:  Negative for chills, fever, malaise/fatigue and weight loss.  HENT:  Negative for hearing loss, sinus pain and sore throat.   Respiratory:  Negative for  cough and hemoptysis.   Cardiovascular:  Negative for chest pain, palpitations, leg swelling and PND.  Gastrointestinal:  Positive for blood in stool (occasional blood streaked stools) and constipation. Negative for abdominal pain, diarrhea, heartburn, nausea and vomiting.  Genitourinary:  Negative for dysuria, frequency and urgency.  Musculoskeletal:  Negative for back pain, myalgias and neck pain.  Skin:  Negative for itching and rash.  Neurological:  Negative for dizziness, tingling, seizures and headaches.  Endo/Heme/Allergies:  Negative for polydipsia.  Psychiatric/Behavioral:  Negative for depression. The patient is not nervous/anxious.     Objective:   BP 90/60 (BP Location: Left Arm, Patient Position: Sitting, Cuff Size: Normal)   Pulse 63   Temp 97.7 F (36.5 C) (Temporal)   Ht 5\' 4"  (1.626 m)   Wt 166 lb 6.1 oz (75.5 kg)   LMP 10/18/2022 (Approximate)   SpO2 98%   BMI 28.56 kg/m  Body mass index is 28.56 kg/m.   General Appearance:    Alert, cooperative, no distress, appears stated age  Head:    Normocephalic, without obvious abnormality, atraumatic  Eyes:    PERRL, conjunctiva/corneas clear, EOM's intact, fundi    benign, both eyes  Ears:    Normal TM's and external ear canals, both ears  Nose:   Nares normal, septum midline, mucosa normal, no drainage    or sinus tenderness  Throat:   Lips, mucosa, and tongue normal; teeth and gums normal  Neck:   Supple, symmetrical, trachea midline, no adenopathy;    thyroid:  no enlargement/tenderness/nodules; no carotid   bruit or JVD  Back:     Symmetric, no curvature, ROM normal, no CVA tenderness  Lungs:     Clear to auscultation bilaterally, respirations unlabored  Chest Wall:    No tenderness or deformity   Heart:    Regular rate and rhythm, S1 and S2 normal, no murmur, rub or gallop  Breast Exam:    Deferred  Abdomen:     Soft, non-tender, bowel sounds active all four quadrants,    no masses, no organomegaly   Genitalia:    Deferred  Extremities:   Extremities normal, atraumatic, no cyanosis or edema  Pulses:   2+ and symmetric all extremities  Skin:   Skin color, texture, turgor normal, no rashes or lesions  Lymph nodes:   Cervical, supraclavicular, and axillary nodes normal  Neurologic:   CNII-XII intact, normal strength, sensation and reflexes    throughout    Assessment/Plan:   Routine physical examination Today patient counseled on age appropriate routine health concerns for screening and prevention, each reviewed and up to date or declined. Immunizations reviewed and up to date or declined. Labs ordered and reviewed. Risk factors for depression reviewed and negative. Hearing function and visual acuity  are intact. ADLs screened and addressed as needed. Functional ability and level of safety reviewed and appropriate. Education, counseling and referrals performed based on assessed risks today. Patient provided with a copy of personalized plan for preventive services.   Attention deficit hyperactivity disorder (ADHD), combined type Uncontrolled We are requesting records from last prescriber We will start Vyvanse 10 mg daily May need to trial Adderall XR 10 mg if insurance requires -- she is ok with this Follow-up in 3 months after starting this, sooner if concerns  Gastroesophageal reflux disease without esophagitis Overall controlled  Irritable bowel syndrome with both constipation and diarrhea Overall controlled She will let us know if she would like to do GI referral  PCOS (polycystic ovarian syndrome) Overall controlled Declines medication    I,Alexis Herring,acting as a scribe for [employer], PA.,have documented all relevant documentation on the behalf of Inda Coke, PA,as directed by  Inda Coke, PA while in the presence of Inda Coke, [place].  I, Inda Coke, [place], have reviewed all documentation for this visit. The documentation on 11/21/22 for the exam,  diagnosis, procedures, and orders are all accurate and complete.    Inda Coke, PA-C [place]

## 2022-12-17 ENCOUNTER — Other Ambulatory Visit: Payer: Self-pay | Admitting: Physician Assistant

## 2023-02-13 NOTE — Progress Notes (Signed)
Kristie Horn is a 22 y.o. female here for a medication refill.  History of Present Illness:   Chief Complaint  Patient presents with   ADHD    Pt has not been on medication due to back order    HPI  ADHD Started Vyvanse 10 mg daily on 11/21/22. She has not started on this because she has not been able to get this medication. She is about to start EMT school.   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   Requesting vaccine updates and hard copy of vaccine records for EMT school.   Past Medical History:  Diagnosis Date   ADHD    Allergy    GERD (gastroesophageal reflux disease)    IBS (irritable bowel syndrome)    Irregular menstrual cycle      Social History   Tobacco Use   Smoking status: Former    Years: .5    Types: Cigarettes   Smokeless tobacco: Never   Tobacco comments:    Surveyor, minerals in college  Vaping Use   Vaping Use: Former  Substance Use Topics   Alcohol use: Yes    Alcohol/week: 1.0 standard drink of alcohol    Types: 1 Cans of beer per week   Drug use: Never    No past surgical history on file.  Family History  Problem Relation Age of Onset   Crohn's disease Mother    ADD / ADHD Mother    Healthy Father    Lung cancer Maternal Grandmother    Alcohol abuse Maternal Grandfather    Cancer Paternal Grandfather        ?lung   Breast cancer Paternal Aunt    Infertility Paternal Aunt    Colon cancer Neg Hx     Allergies  Allergen Reactions   Other Hives    NUTS   Other     Nuts and aloe vera   Shellfish Allergy Nausea Only   Aloe Rash    Current Medications:   Current Outpatient Medications:    Acetaminophen (MIDOL PO), Take 1 tablet by mouth as needed., Disp: , Rfl:    acetaminophen (TYLENOL) 325 MG tablet, Take 1,300 mg by mouth every 6 (six) hours as needed., Disp: , Rfl:    lisdexamfetamine (VYVANSE) 10 MG capsule, Take 1 capsule (10 mg total) by mouth daily., Disp: 30 each, Rfl: 0    lisdexamfetamine (VYVANSE) 10 MG capsule, Take 1 capsule (10 mg total) by mouth daily before breakfast., Disp: 30 capsule, Rfl: 0   lisdexamfetamine (VYVANSE) 10 MG capsule, Take 1 capsule (10 mg total) by mouth daily before breakfast., Disp: 30 capsule, Rfl: 0   lisdexamfetamine (VYVANSE) 10 MG capsule, Take 1 capsule (10 mg total) by mouth daily before breakfast., Disp: 30 capsule, Rfl: 0   Review of Systems:   Review of Systems  Constitutional:  Negative for fever and malaise/fatigue.  HENT:  Negative for congestion.   Eyes:  Negative for blurred vision.  Respiratory:  Negative for cough and shortness of breath.   Cardiovascular:  Negative for chest pain, palpitations and leg swelling.  Gastrointestinal:  Negative for vomiting.  Musculoskeletal:  Negative for back pain.  Skin:  Negative for rash.  Neurological:  Negative for loss of consciousness and headaches.    Vitals:   Vitals:   02/20/23 0849  BP: 100/70  Pulse: (!) 54  Temp: (!) 97.5 F (36.4 C)  TempSrc: Temporal  SpO2: 99%  Weight:  165 lb 6.1 oz (75 kg)  Height: 5\' 4"  (1.626 m)     Body mass index is 28.39 kg/m.  Physical Exam:   Physical Exam Constitutional:      Appearance: Normal appearance. She is well-developed.  HENT:     Head: Normocephalic and atraumatic.  Eyes:     General: Lids are normal.     Extraocular Movements: Extraocular movements intact.     Conjunctiva/sclera: Conjunctivae normal.  Pulmonary:     Effort: Pulmonary effort is normal.  Musculoskeletal:        General: Normal range of motion.     Cervical back: Normal range of motion and neck supple.  Skin:    General: Skin is warm and dry.  Neurological:     Mental Status: She is alert and oriented to person, place, and time.  Psychiatric:        Attention and Perception: Attention and perception normal.        Mood and Affect: Mood normal.        Behavior: Behavior normal.        Thought Content: Thought content normal.         Judgment: Judgment normal.     Assessment and Plan:   Screening-pulmonary TB Complete quantiferon gold today  Attention deficit hyperactivity disorder (ADHD), combined type Uncontrolled Unable to obtain rx Will send to [facility] to see if they have in stock Follow-up in 6 months, sooner if concerns  I,Alexander Ruley,acting as a scribe for [employer], PA.,have documented all relevant documentation on the behalf of Kristie Motto, PA,as directed by  Kristie Motto, PA while in the presence of Kristie Horn, [place].   I, Kristie Horn, [place], have reviewed all documentation for this visit. The documentation on 02/20/23 for the exam, diagnosis, procedures, and orders are all accurate and complete.    Kristie Motto, PA-C

## 2023-02-20 ENCOUNTER — Other Ambulatory Visit (HOSPITAL_BASED_OUTPATIENT_CLINIC_OR_DEPARTMENT_OTHER): Payer: Self-pay

## 2023-02-20 ENCOUNTER — Ambulatory Visit (INDEPENDENT_AMBULATORY_CARE_PROVIDER_SITE_OTHER): Payer: BC Managed Care – PPO | Admitting: Physician Assistant

## 2023-02-20 ENCOUNTER — Encounter: Payer: Self-pay | Admitting: Physician Assistant

## 2023-02-20 VITALS — BP 100/70 | HR 54 | Temp 97.5°F | Ht 64.0 in | Wt 165.4 lb

## 2023-02-20 DIAGNOSIS — Z23 Encounter for immunization: Secondary | ICD-10-CM | POA: Diagnosis not present

## 2023-02-20 DIAGNOSIS — Z111 Encounter for screening for respiratory tuberculosis: Secondary | ICD-10-CM

## 2023-02-20 DIAGNOSIS — F902 Attention-deficit hyperactivity disorder, combined type: Secondary | ICD-10-CM

## 2023-02-20 MED ORDER — LISDEXAMFETAMINE DIMESYLATE 10 MG PO CAPS
10.0000 mg | ORAL_CAPSULE | Freq: Every day | ORAL | 0 refills | Status: DC
  Filled 2023-02-20: qty 30, 30d supply, fill #0

## 2023-02-20 NOTE — Patient Instructions (Signed)
It was great to see you!  We will send your Vyvanse to this pharmacy - I'm hoping that they will have this in stock!    Take care,  Jarold Motto PA-C

## 2023-02-22 LAB — QUANTIFERON-TB GOLD PLUS
Mitogen-NIL: 5.36 IU/mL
NIL: 0.02 IU/mL
QuantiFERON-TB Gold Plus: NEGATIVE
TB1-NIL: 0 IU/mL
TB2-NIL: 0 IU/mL

## 2023-03-21 ENCOUNTER — Other Ambulatory Visit: Payer: Self-pay | Admitting: Physician Assistant

## 2023-03-21 ENCOUNTER — Other Ambulatory Visit (HOSPITAL_BASED_OUTPATIENT_CLINIC_OR_DEPARTMENT_OTHER): Payer: Self-pay

## 2023-03-21 MED ORDER — LISDEXAMFETAMINE DIMESYLATE 10 MG PO CAPS
10.0000 mg | ORAL_CAPSULE | Freq: Every day | ORAL | 0 refills | Status: AC
  Filled 2023-03-21: qty 30, 30d supply, fill #0

## 2023-03-21 NOTE — Telephone Encounter (Signed)
Last refill: 4/174/24 #30, 0 Last OV: 02/20/23 dx. TB screening, ADHD
